# Patient Record
Sex: Female | Born: 1995 | Hispanic: No | Marital: Single | Smoking: Never smoker
Health system: Southern US, Community
[De-identification: ages and names within clinical notes are randomized; demographics above are authoritative.]

## PROBLEM LIST (undated history)

## (undated) DIAGNOSIS — D560 Alpha thalassemia: Secondary | ICD-10-CM

## (undated) HISTORY — DX: Alpha thalassemia: D56.0

---
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
## 2016-04-14 ENCOUNTER — Encounter: Payer: Self-pay | Admitting: Family Medicine

## 2021-01-08 ENCOUNTER — Emergency Department (HOSPITAL_COMMUNITY)
Admission: EM | Admit: 2021-01-08 | Discharge: 2021-01-08 | Disposition: A | Discharge status: Home / Self Care | Attending: Emergency Medicine | Admitting: Emergency Medicine

## 2021-01-08 ENCOUNTER — Other Ambulatory Visit: Payer: Self-pay

## 2021-01-08 ENCOUNTER — Encounter (HOSPITAL_COMMUNITY): Payer: Self-pay | Admitting: Emergency Medicine

## 2021-01-08 DIAGNOSIS — L539 Erythematous condition, unspecified: Secondary | ICD-10-CM | POA: Insufficient documentation

## 2021-01-08 DIAGNOSIS — U071 COVID-19: Secondary | ICD-10-CM | POA: Diagnosis not present

## 2021-01-08 DIAGNOSIS — R519 Headache, unspecified: Secondary | ICD-10-CM | POA: Diagnosis present

## 2021-01-08 DIAGNOSIS — R590 Localized enlarged lymph nodes: Secondary | ICD-10-CM | POA: Diagnosis not present

## 2021-01-08 LAB — GROUP A STREP BY PCR: Group A Strep by PCR: NOT DETECTED

## 2021-01-08 MED ORDER — DEXAMETHASONE SODIUM PHOSPHATE 10 MG/ML IJ SOLN
8.0000 mg | Freq: Once | INTRAMUSCULAR | Status: AC
  Administered 2021-01-08: 8 mg via INTRAMUSCULAR
  Filled 2021-01-08: qty 1

## 2021-01-08 MED ORDER — KETOROLAC TROMETHAMINE 30 MG/ML IJ SOLN
30.0000 mg | Freq: Once | INTRAMUSCULAR | Status: AC
  Administered 2021-01-08: 30 mg via INTRAMUSCULAR
  Filled 2021-01-08: qty 1

## 2021-01-08 NOTE — ED Triage Notes (Addendum)
Pt states that she has had covid symptoms since Wednesday.Positive test Thursday. C/O fever/chills, headache and sore throat. Pt also states that where her spleen was removed is sore as well.  Alert and oriented.

## 2021-01-08 NOTE — Discharge Instructions (Signed)
Please read instructions below.  You can alternate Tylenol/acetaminophen and Advil/ibuprofen/Motrin every 4 hours for sore throat, body aches, headache or fever.  Drink plenty of water.  Use saline nasal spray for congestion. Wash your hands frequently. Follow up with your primary care provider if symptoms persist. Return to the ER for worsening sore throat, worsening swelling in your throat, if you are unable to swallow liquids, significant shortness of breath, uncontrollable vomiting, severe chest pain, or other concerning symptoms.

## 2021-01-08 NOTE — ED Provider Notes (Signed)
[facility]-EMERGENCY DEPT Provider Note   CSN: [id number] Arrival date & time: 01/08/21  1408     History Chief Complaint  Patient presents with  . Covid Positive    Madeline Jarvis is a 25 y.o. female past medical history of alpha thalassemia status post splenectomy in 2013, presenting to the emergency department for evaluation of her symptoms related to COVID-19.  She states symptoms began on Wednesday, she had a positive test on Thursday.  She complains of fever and chills that occurred initially, however resolved.  She endorses current headache, sore throat and body aches treated her symptoms with Tylenol yesterday.  Sometimes she feels short of breath and has a cough though only when she takes a deep breath.  She states she is having pain with swallowing.  She is vaccinated against Covid.  The history is provided by the patient.       Past Medical History:  Diagnosis Date  . 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      Patient Active Problem List   Diagnosis Date Noted  . 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 Acne vulgaris 05/03/2012  . Dysmenorrhea in the adolescent 05/03/2012  . 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 Gallstones and inflammation of gallbladder without obstruction 04/03/2011  . Thalassemia, alpha (HCC) 09/02/2007     The histories are reviewed .  OB History   No obstetric history on file.     No family history on file.  Social History   Tobacco Use  . Smoking status: Never Smoker    Home Medications Prior to Admission medications   Not on File    Allergies    Avocado, Other, and Turmeric  Review of Systems   Review of Systems  Constitutional: Positive for chills and fever.  HENT: Positive for sore throat. Negative for congestion and rhinorrhea.   Cardiovascular: Negative for chest pain.   Gastrointestinal: Negative for diarrhea and vomiting.  Musculoskeletal: Positive for myalgias.  Neurological: Positive for headaches.  All other systems reviewed and are negative.   Physical Exam Updated Vital Signs BP 106/73   Pulse 96   Temp 98.9 F (37.2 C) (Oral)   Resp 16   Ht 5' (1.524 m)   Wt 52.2 kg   LMP 12/29/2020 (Approximate)   SpO2 96%   BMI 22.46 kg/m   Physical Exam Vitals and nursing note reviewed.  Constitutional:      General: She is not in acute distress.    Appearance: She is well-developed and well-nourished. She is not ill-appearing.  HENT:     Head: Normocephalic and atraumatic.     Mouth/Throat:     Comments: Bilateral tonsillar erythema, edema, and exudates.  Uvula is midline, tolerating secretions.  No voice changes Eyes:     Conjunctiva/sclera: Conjunctivae normal.  Neck:     Comments: Bilateral anterior cervical adenopathy Cardiovascular:     Rate and Rhythm: Normal rate and regular rhythm.  Pulmonary:     Effort: Pulmonary effort is normal. No respiratory distress.     Breath sounds: Normal breath sounds.     Comments: Speaking in full sentences with normal work of breathing Abdominal:     General: Bowel sounds are normal.     Palpations: Abdomen is soft.     Tenderness: There is no abdominal tenderness.  Musculoskeletal:     Cervical back: Normal range of motion and neck supple.  Tenderness present.  Lymphadenopathy:     Cervical: Cervical adenopathy present.  Skin:    General: Skin is warm.  Neurological:     Mental Status: She is alert.  Psychiatric:        Mood and Affect: Mood and affect and mood normal.        Behavior: Behavior normal.     ED Results / Procedures / Treatments   Labs (all labs ordered are listed, but only abnormal results are displayed) Labs Reviewed  GROUP A STREP BY PCR    EKG None  Radiology No results found.  Procedures Procedures   Medications Ordered in ED Medications  ketorolac  (TORADOL) 30 MG/ML injection 30 mg (30 mg Intramuscular Given 01/08/21 1822)  dexamethasone (DECADRON) injection 8 mg (8 mg Intramuscular Given 01/08/21 1825)    ED Course  I have reviewed the triage vital signs and the nursing notes.  Pertinent labs & imaging results that were available during my care of the patient were reviewed by me and considered in my medical decision making (see chart for details).  Clinical Course as of 01/08/21 2130  Sat Jan 08, 2021  1916 I was directly involved in this patients medical care.  [JH]    Clinical Course User Index [JH] Cheryll Cockayne, MD   MDM Rules/Calculators/A&P                          Patient with recent diagnosis of COVID-19, presenting for evaluation of symptoms of headache, body aches, sore throat.  She had fever initially few days ago, however none today.  No abdominal complaints.  On exam she is well-appearing, tolerating secretions, normal work of breathing.  O2 sat is 97% on room air.  She is speaking full sentences.  Lungs are clear to auscultation bilaterally.  Pharynx does reveal tonsillar edema, erythema with some exudates.  Uvula is midline, exam is not concerning for PTA.  Strep swab was ordered considering ENT exam and is negative.  Will treat as a viral at this time with supportive measures, symptomatic care.  She is treated with Toradol and Decadron here with improvement in symptoms.  Recommended for outpatient follow-up and strict return precautions.  Patient discharged in no acute distress.  Care plan discussed with attending Dr. Audley Hose.  Discussed results, findings, treatment and follow up. Patient advised of return precautions. Patient verbalized understanding and agreed with plan.  Madeline Jarvis was evaluated in Emergency Department on 01/08/2021 for the symptoms described in the history of present illness. She was evaluated in the context of the global COVID-19 pandemic, which necessitated consideration that the patient  might be at risk for infection with the SARS-CoV-2 virus that causes COVID-19. Institutional protocols and algorithms that pertain to the evaluation of patients at risk for COVID-19 are in a state of rapid change based on information released by regulatory bodies including the CDC and federal and state organizations. These policies and algorithms were followed during the patient's care in the ED.  Final Clinical Impression(s) / ED Diagnoses Final diagnoses:  COVID-19    Rx / DC Orders ED Discharge Orders    None       Peng Thorstenson, [place] N, PA-C 01/08/21 2130    Cheryll Cockayne, MD 01/09/21 2306

## 2021-08-04 DIAGNOSIS — Z124 Encounter for screening for malignant neoplasm of cervix: Secondary | ICD-10-CM | POA: Diagnosis not present

## 2021-08-04 DIAGNOSIS — Z01419 Encounter for gynecological examination (general) (routine) without abnormal findings: Secondary | ICD-10-CM | POA: Diagnosis not present

## 2021-08-04 DIAGNOSIS — Z113 Encounter for screening for infections with a predominantly sexual mode of transmission: Secondary | ICD-10-CM | POA: Diagnosis not present

## 2022-02-07 DIAGNOSIS — Z3689 Encounter for other specified antenatal screening: Secondary | ICD-10-CM | POA: Diagnosis not present

## 2022-02-07 DIAGNOSIS — Z3687 Encounter for antenatal screening for uncertain dates: Secondary | ICD-10-CM | POA: Diagnosis not present

## 2022-02-07 DIAGNOSIS — O3680X0 Pregnancy with inconclusive fetal viability, not applicable or unspecified: Secondary | ICD-10-CM | POA: Diagnosis not present

## 2022-02-07 DIAGNOSIS — O283 Abnormal ultrasonic finding on antenatal screening of mother: Secondary | ICD-10-CM | POA: Diagnosis not present

## 2022-02-07 DIAGNOSIS — Z3A11 11 weeks gestation of pregnancy: Secondary | ICD-10-CM | POA: Diagnosis not present

## 2022-02-15 DIAGNOSIS — Z3A12 12 weeks gestation of pregnancy: Secondary | ICD-10-CM | POA: Diagnosis not present

## 2022-02-15 DIAGNOSIS — O358XX0 Maternal care for other (suspected) fetal abnormality and damage, not applicable or unspecified: Secondary | ICD-10-CM | POA: Diagnosis not present

## 2022-02-15 DIAGNOSIS — Z3689 Encounter for other specified antenatal screening: Secondary | ICD-10-CM | POA: Diagnosis not present

## 2022-02-27 DIAGNOSIS — O021 Missed abortion: Secondary | ICD-10-CM | POA: Diagnosis not present

## 2022-03-15 DIAGNOSIS — O034 Incomplete spontaneous abortion without complication: Secondary | ICD-10-CM | POA: Diagnosis not present

## 2022-04-12 DIAGNOSIS — O021 Missed abortion: Secondary | ICD-10-CM | POA: Diagnosis not present
